# Patient Record
Sex: Male | Born: 1995 | Race: Black or African American | Hispanic: No | Marital: Single | Smoking: Current every day smoker
Health system: Southern US, Community
[De-identification: ages and names within clinical notes are randomized; demographics above are authoritative.]

## PROBLEM LIST (undated history)

## (undated) DIAGNOSIS — I319 Disease of pericardium, unspecified: Secondary | ICD-10-CM

## (undated) DIAGNOSIS — J45909 Unspecified asthma, uncomplicated: Secondary | ICD-10-CM

---

## 2016-11-10 ENCOUNTER — Emergency Department (HOSPITAL_BASED_OUTPATIENT_CLINIC_OR_DEPARTMENT_OTHER): Payer: BLUE CROSS/BLUE SHIELD

## 2016-11-10 ENCOUNTER — Emergency Department (HOSPITAL_BASED_OUTPATIENT_CLINIC_OR_DEPARTMENT_OTHER)
Admission: EM | Admit: 2016-11-10 | Discharge: 2016-11-10 | Disposition: A | Discharge status: Home / Self Care | Payer: BLUE CROSS/BLUE SHIELD | Attending: Emergency Medicine | Admitting: Emergency Medicine

## 2016-11-10 ENCOUNTER — Encounter (HOSPITAL_BASED_OUTPATIENT_CLINIC_OR_DEPARTMENT_OTHER): Payer: Self-pay | Admitting: Emergency Medicine

## 2016-11-10 DIAGNOSIS — R05 Cough: Secondary | ICD-10-CM | POA: Diagnosis present

## 2016-11-10 DIAGNOSIS — R0981 Nasal congestion: Secondary | ICD-10-CM

## 2016-11-10 DIAGNOSIS — J4521 Mild intermittent asthma with (acute) exacerbation: Secondary | ICD-10-CM | POA: Insufficient documentation

## 2016-11-10 DIAGNOSIS — R059 Cough, unspecified: Secondary | ICD-10-CM

## 2016-11-10 DIAGNOSIS — F17210 Nicotine dependence, cigarettes, uncomplicated: Secondary | ICD-10-CM | POA: Diagnosis not present

## 2016-11-10 DIAGNOSIS — J029 Acute pharyngitis, unspecified: Secondary | ICD-10-CM | POA: Insufficient documentation

## 2016-11-10 HISTORY — DX: Unspecified asthma, uncomplicated: J45.909

## 2016-11-10 MED ORDER — IPRATROPIUM BROMIDE 0.02 % IN SOLN
0.5000 mg | Freq: Once | RESPIRATORY_TRACT | Status: AC
  Administered 2016-11-10: 0.5 mg via RESPIRATORY_TRACT
  Filled 2016-11-10: qty 2.5

## 2016-11-10 MED ORDER — ALBUTEROL SULFATE HFA 108 (90 BASE) MCG/ACT IN AERS: 2.0000 | INHALATION_SPRAY | RESPIRATORY_TRACT | 3 refills | Status: AC | PRN

## 2016-11-10 MED ORDER — PREDNISONE 20 MG PO TABS: 60.0000 mg | ORAL_TABLET | Freq: Every day | ORAL | 0 refills | Status: AC

## 2016-11-10 MED ORDER — DOXYCYCLINE HYCLATE 100 MG PO CAPS: 100.0000 mg | ORAL_CAPSULE | Freq: Two times a day (BID) | ORAL | 0 refills | Status: AC

## 2016-11-10 MED ORDER — ALBUTEROL SULFATE (2.5 MG/3ML) 0.083% IN NEBU
5.0000 mg | INHALATION_SOLUTION | Freq: Once | RESPIRATORY_TRACT | Status: AC
  Administered 2016-11-10: 5 mg via RESPIRATORY_TRACT
  Filled 2016-11-10: qty 6

## 2016-11-10 NOTE — Discharge Instructions (Signed)
It was our pleasure to provide your ER care today - we hope that you feel better.  Use albuterol inhaler as need.  Take prednisone as prescribed.  Take doxycycline as prescribed.  Follow up with primary care doctor in 1 week if symptoms fail to improve/resolve.  Return to ER if worse, new symptoms, trouble breathing, other concern.

## 2016-11-10 NOTE — ED Triage Notes (Signed)
Patient states that for the last 4 days he has had chest congestion and a cough. He states that his chest rattle and then proceeds to show the nurse how he can make himself have audible rattles. PAtient states that he was with his father who have PNA recently so he is concerned

## 2016-11-10 NOTE — ED Provider Notes (Signed)
[facility] Provider [facility]   CSN: [id number] Arrival date & time: 11/10/16  1833  By signing my name below, I, Diona BrownerJennifer Gorman, attest that this documentation has been prepared under the direction and in the presence of Cathren LaineSteinl, Orlyn Odonoghue, MD. Electronically Signed: Diona BrownerJennifer Gorman, ED Scribe. 11/10/16. 8:03 PM.  History   Chief Complaint Chief Complaint  Patient presents with  . Cough    HPI Michael Pollard is a 21 y.o. male with a PMHx of asthma, who presents to the Emergency Department complaining of a productive cough for the last 4 days. Associated sx include sinus congestion, sore throat, sinus pressure, and wheezing. Pt has been around his father who was dx with PNA. He used his as needed inhaler today with mild relief. Pt denies fever, or any other complaints at this time.   The history is provided by the patient. No language interpreter was used.    Past Medical History:  Diagnosis Date  . Asthma     There are no active problems to display for this patient.   History reviewed. No pertinent surgical history.     Home Medications    Prior to Admission medications   Not on File    Family History History reviewed. No pertinent family history.  Social History Social History  Substance Use Topics  . Smoking status: Current Every Day Smoker  . Smokeless tobacco: Never Used  . Alcohol use Yes     Comment: rare     Allergies   Patient has no known allergies.   Review of Systems Review of Systems  Constitutional: Negative for fever.  HENT: Positive for congestion, sinus pressure and sore throat.   Eyes: Negative for redness.  Respiratory: Positive for cough. Negative for shortness of breath.   Cardiovascular: Negative for chest pain.  Gastrointestinal: Negative for abdominal pain.  Genitourinary: Negative for flank pain.  Musculoskeletal: Negative for back pain, neck pain and neck stiffness.  Skin: Negative for rash.  Neurological: Negative for  headaches.  Hematological: Does not bruise/bleed easily.  Psychiatric/Behavioral: Negative for confusion.     Physical Exam Updated Vital Signs BP 135/76 (BP Location: Left Arm)   Pulse 86   Temp 98.1 F (36.7 C) (Oral)   Resp 18   Ht 5\' 9"  (1.753 m)   Wt 208 lb (94.3 kg)   SpO2 100%   BMI 30.72 kg/m   Physical Exam  Constitutional: He appears well-developed and well-nourished.  HENT:  Head: Normocephalic.  Mouth/Throat: Oropharynx is clear and moist.  + Nasal congestion.   Eyes: EOM are normal.  Neck: Normal range of motion.  No stiffness or rigidity  Cardiovascular: Normal rate, regular rhythm, normal heart sounds and intact distal pulses.  Exam reveals no gallop and no friction rub.   No murmur heard. Pulmonary/Chest: Effort normal. He has wheezes.  + Bilateral wheezing.  Abdominal: Soft. He exhibits no distension. There is no tenderness.  Musculoskeletal: He exhibits no edema or tenderness.  Neurological: He is alert.  Skin: No rash noted.  Psychiatric: He has a normal mood and affect.  Nursing note and vitals reviewed.    ED Treatments / Results  DIAGNOSTIC STUDIES: Oxygen Saturation is 100% on RA, normal by my interpretation.   COORDINATION OF CARE: 8:03 PM-Discussed next steps with pt. Pt verbalized understanding and is agreeable with the plan.   Labs (all labs ordered are listed, but only abnormal results are displayed) Labs Reviewed - No data to display  EKG  EKG Interpretation  None       Radiology Dg Chest 2 View  Result Date: 11/10/2016 CLINICAL DATA:  Cough and congestion EXAM: CHEST  2 VIEW COMPARISON:  Report 12/27/2005 FINDINGS: The heart size and mediastinal contours are within normal limits. Both lungs are clear. The visualized skeletal structures are unremarkable. IMPRESSION: No active cardiopulmonary disease. Electronically Signed   By: Jasmine Pang M.D.   On: 11/10/2016 19:25    Procedures Procedures (including critical care  time)  Medications Ordered in ED Medications - No data to display   Initial Impression / Assessment and Plan / ED Course  I have reviewed the triage vital signs and the nursing notes.  Pertinent labs & imaging results that were available during my care of the patient were reviewed by me and considered in my medical decision making (see chart for details).  Albuterol and atrovent neb.  Recheck wheezing improved. Breathing comfortably.    MDM   Final Clinical Impressions(s) / ED Diagnoses   Final diagnoses:  None    New Prescriptions New Prescriptions   No medications on file   I personally performed the services described in this documentation, which was scribed in my presence. The recorded information has been reviewed and considered. Cathren Laine, MD     Cathren Laine, MD 11/10/16 [phone]

## 2019-01-17 IMAGING — CR DG CHEST 2V
2 series · 2 of 2 positions shown · non-contrast
Comparison: Report 12/27/2005

CLINICAL DATA: Cough and congestion

EXAM:
CHEST  2 VIEW

[w chest pa]
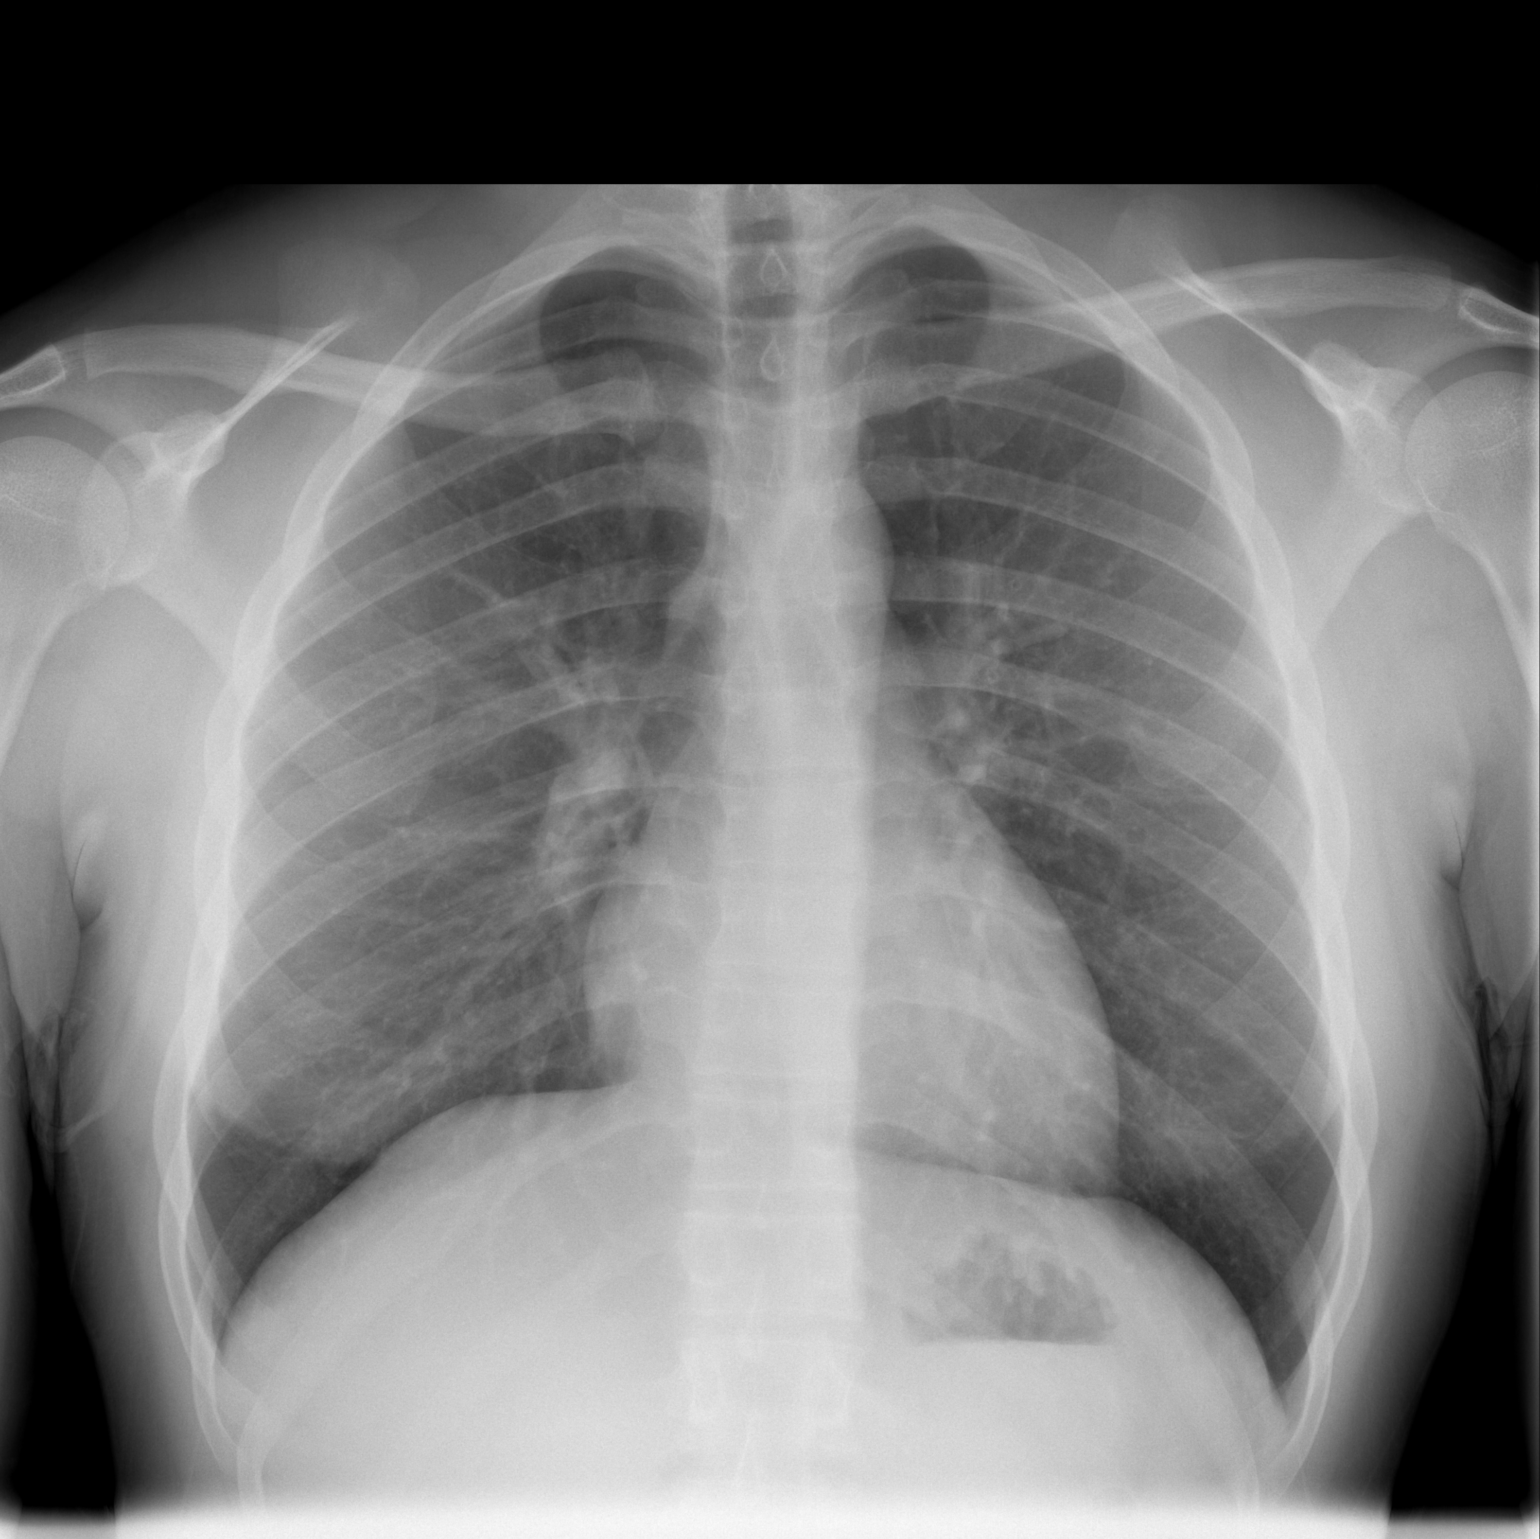

[w chest lat]
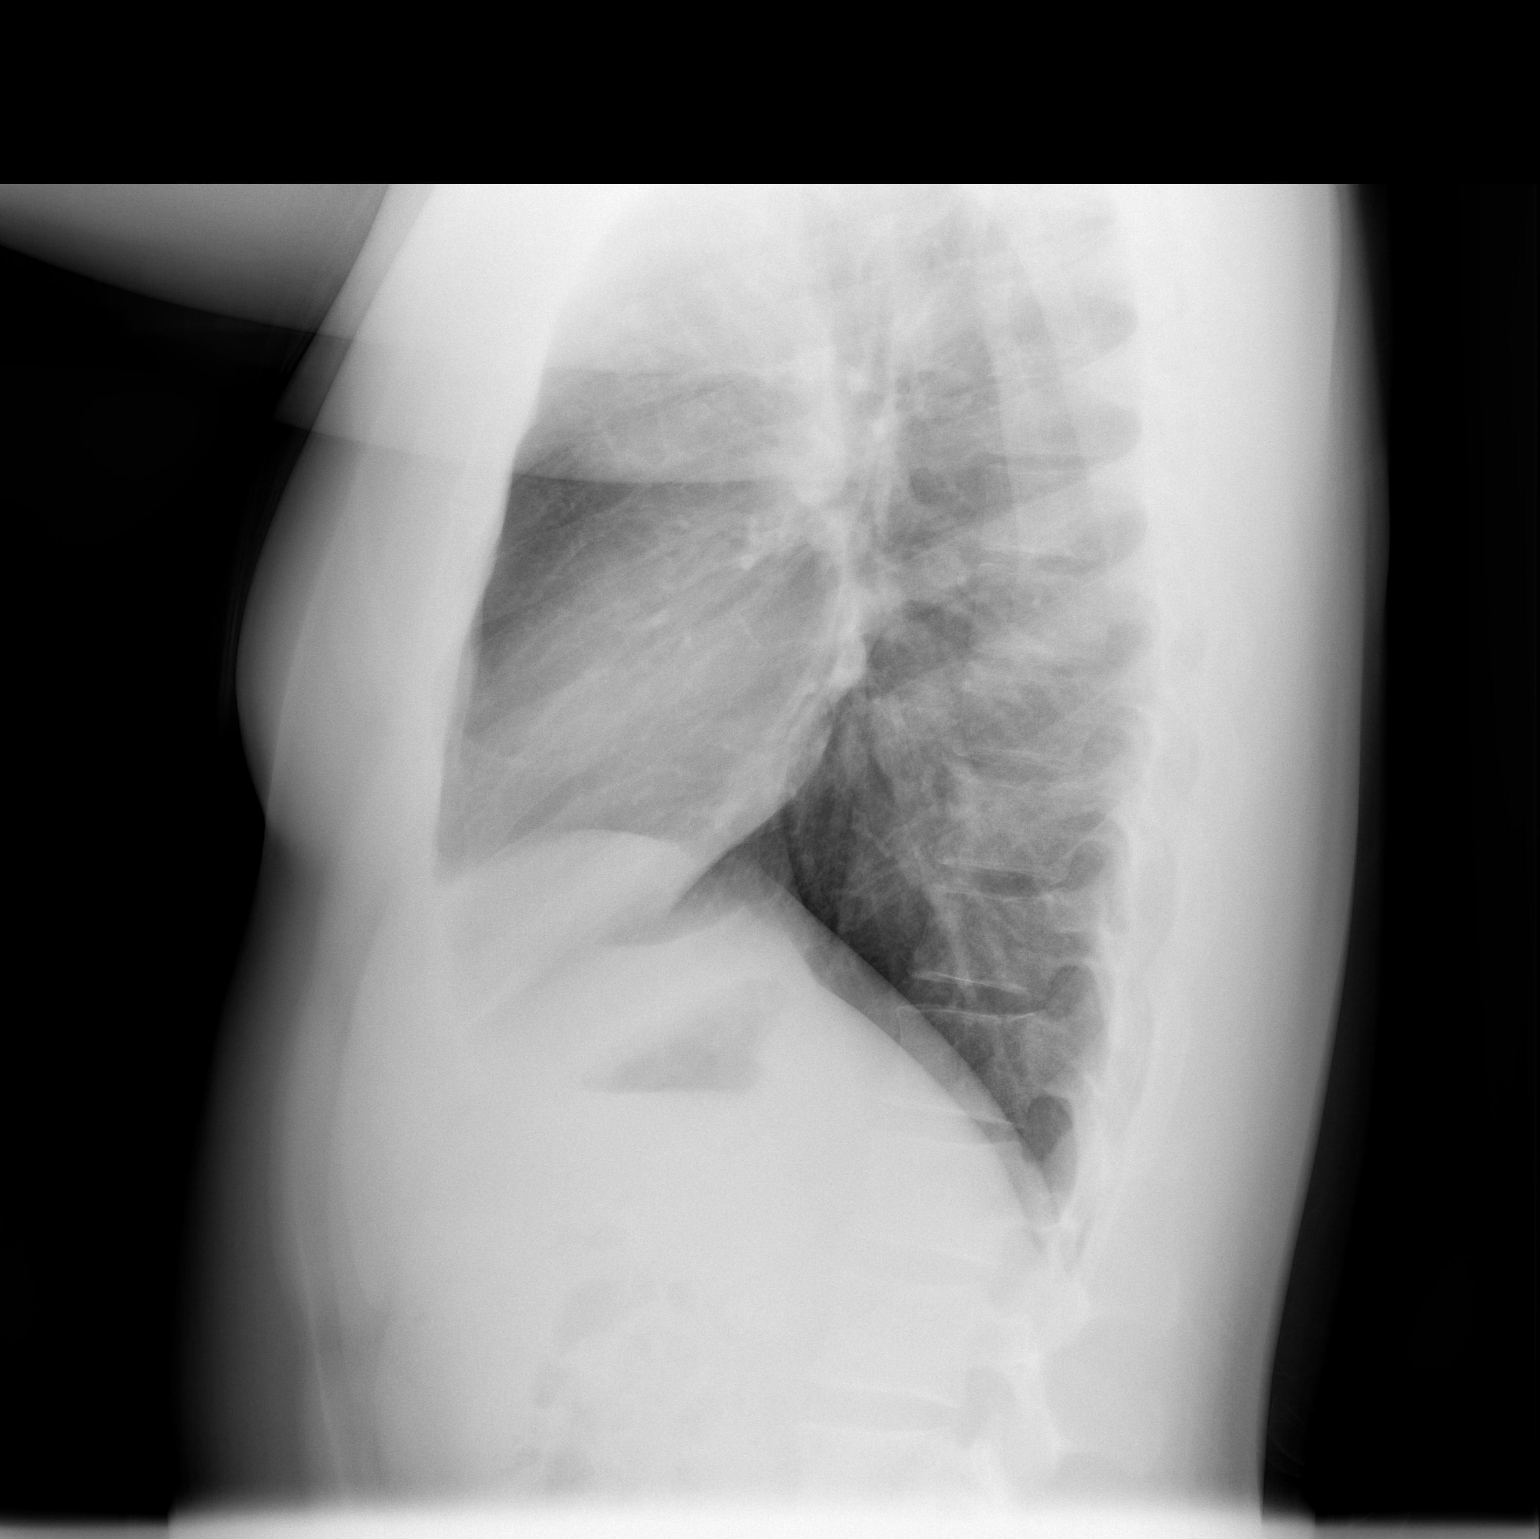

[2 of 2 positions shown; findings below may reference images not displayed]

FINDINGS: The heart size and mediastinal contours are within normal limits.
Both lungs are clear. The visualized skeletal structures are
unremarkable.
IMPRESSION: No active cardiopulmonary disease.

## 2019-03-25 ENCOUNTER — Other Ambulatory Visit: Payer: Self-pay

## 2019-03-25 ENCOUNTER — Emergency Department (HOSPITAL_BASED_OUTPATIENT_CLINIC_OR_DEPARTMENT_OTHER)
Admission: EM | Admit: 2019-03-25 | Discharge: 2019-03-25 | Disposition: A | Discharge status: Home / Self Care | Payer: 59 | Attending: Emergency Medicine | Admitting: Emergency Medicine

## 2019-03-25 ENCOUNTER — Encounter (HOSPITAL_BASED_OUTPATIENT_CLINIC_OR_DEPARTMENT_OTHER): Payer: Self-pay | Admitting: Emergency Medicine

## 2019-03-25 DIAGNOSIS — Z7901 Long term (current) use of anticoagulants: Secondary | ICD-10-CM | POA: Diagnosis not present

## 2019-03-25 DIAGNOSIS — F17200 Nicotine dependence, unspecified, uncomplicated: Secondary | ICD-10-CM | POA: Insufficient documentation

## 2019-03-25 DIAGNOSIS — Y999 Unspecified external cause status: Secondary | ICD-10-CM | POA: Diagnosis not present

## 2019-03-25 DIAGNOSIS — Y92410 Unspecified street and highway as the place of occurrence of the external cause: Secondary | ICD-10-CM | POA: Diagnosis not present

## 2019-03-25 DIAGNOSIS — S161XXA Strain of muscle, fascia and tendon at neck level, initial encounter: Secondary | ICD-10-CM | POA: Insufficient documentation

## 2019-03-25 DIAGNOSIS — Y929 Unspecified place or not applicable: Secondary | ICD-10-CM | POA: Diagnosis not present

## 2019-03-25 DIAGNOSIS — J45909 Unspecified asthma, uncomplicated: Secondary | ICD-10-CM | POA: Diagnosis not present

## 2019-03-25 DIAGNOSIS — Y939 Activity, unspecified: Secondary | ICD-10-CM | POA: Insufficient documentation

## 2019-03-25 DIAGNOSIS — S199XXA Unspecified injury of neck, initial encounter: Secondary | ICD-10-CM | POA: Diagnosis present

## 2019-03-25 HISTORY — DX: Disease of pericardium, unspecified: I31.9

## 2019-03-25 NOTE — ED Notes (Signed)
Patient verbalizes understanding of discharge instructions. Opportunity for questioning and answers were provided. Armband removed by staff, pt discharged from ED.  
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
## 2021-12-25 DIAGNOSIS — J302 Other seasonal allergic rhinitis: Secondary | ICD-10-CM | POA: Diagnosis not present

## 2021-12-25 DIAGNOSIS — J029 Acute pharyngitis, unspecified: Secondary | ICD-10-CM | POA: Diagnosis not present

## 2021-12-25 DIAGNOSIS — Z0001 Encounter for general adult medical examination with abnormal findings: Secondary | ICD-10-CM | POA: Diagnosis not present

## 2021-12-25 DIAGNOSIS — Z131 Encounter for screening for diabetes mellitus: Secondary | ICD-10-CM | POA: Diagnosis not present

## 2021-12-25 DIAGNOSIS — Z1329 Encounter for screening for other suspected endocrine disorder: Secondary | ICD-10-CM | POA: Diagnosis not present

## 2021-12-25 DIAGNOSIS — Z136 Encounter for screening for cardiovascular disorders: Secondary | ICD-10-CM | POA: Diagnosis not present

## 2021-12-25 DIAGNOSIS — J4520 Mild intermittent asthma, uncomplicated: Secondary | ICD-10-CM | POA: Diagnosis not present

## 2022-02-04 DIAGNOSIS — J039 Acute tonsillitis, unspecified: Secondary | ICD-10-CM | POA: Diagnosis not present

## 2023-01-31 DIAGNOSIS — R079 Chest pain, unspecified: Secondary | ICD-10-CM | POA: Diagnosis not present

## 2023-01-31 DIAGNOSIS — R0689 Other abnormalities of breathing: Secondary | ICD-10-CM | POA: Diagnosis not present

## 2023-01-31 DIAGNOSIS — Z8679 Personal history of other diseases of the circulatory system: Secondary | ICD-10-CM | POA: Diagnosis not present

## 2023-01-31 DIAGNOSIS — Z753 Unavailability and inaccessibility of health-care facilities: Secondary | ICD-10-CM | POA: Diagnosis not present

## 2023-01-31 DIAGNOSIS — R0789 Other chest pain: Secondary | ICD-10-CM | POA: Diagnosis not present

## 2023-07-25 DIAGNOSIS — R55 Syncope and collapse: Secondary | ICD-10-CM | POA: Diagnosis not present

## 2023-07-25 DIAGNOSIS — S0993XA Unspecified injury of face, initial encounter: Secondary | ICD-10-CM | POA: Diagnosis not present

## 2023-07-25 DIAGNOSIS — S032XXA Dislocation of tooth, initial encounter: Secondary | ICD-10-CM | POA: Diagnosis not present

## 2023-07-25 DIAGNOSIS — S0990XA Unspecified injury of head, initial encounter: Secondary | ICD-10-CM | POA: Diagnosis not present

## 2023-07-25 DIAGNOSIS — W19XXXA Unspecified fall, initial encounter: Secondary | ICD-10-CM | POA: Diagnosis not present

## 2023-07-25 DIAGNOSIS — S01511A Laceration without foreign body of lip, initial encounter: Secondary | ICD-10-CM | POA: Diagnosis not present

## 2023-07-26 DIAGNOSIS — S0993XA Unspecified injury of face, initial encounter: Secondary | ICD-10-CM | POA: Diagnosis not present

## 2023-07-26 DIAGNOSIS — R9431 Abnormal electrocardiogram [ECG] [EKG]: Secondary | ICD-10-CM | POA: Diagnosis not present

## 2023-08-17 DIAGNOSIS — R55 Syncope and collapse: Secondary | ICD-10-CM | POA: Diagnosis not present

## 2023-08-17 DIAGNOSIS — R197 Diarrhea, unspecified: Secondary | ICD-10-CM | POA: Diagnosis not present

## 2023-08-17 DIAGNOSIS — R110 Nausea: Secondary | ICD-10-CM | POA: Diagnosis not present

## 2023-08-17 DIAGNOSIS — U071 COVID-19: Secondary | ICD-10-CM | POA: Diagnosis not present

## 2023-08-17 DIAGNOSIS — R42 Dizziness and giddiness: Secondary | ICD-10-CM | POA: Diagnosis not present

## 2023-08-17 DIAGNOSIS — Z753 Unavailability and inaccessibility of health-care facilities: Secondary | ICD-10-CM | POA: Diagnosis not present

## 2023-08-18 DIAGNOSIS — R000 Tachycardia, unspecified: Secondary | ICD-10-CM | POA: Diagnosis not present

## 2023-10-22 DIAGNOSIS — F4321 Adjustment disorder with depressed mood: Secondary | ICD-10-CM | POA: Diagnosis not present

## 2024-02-20 DIAGNOSIS — Z79899 Other long term (current) drug therapy: Secondary | ICD-10-CM | POA: Diagnosis not present

## 2024-02-20 DIAGNOSIS — Z6827 Body mass index (BMI) 27.0-27.9, adult: Secondary | ICD-10-CM | POA: Diagnosis not present

## 2024-02-20 DIAGNOSIS — Z7251 High risk heterosexual behavior: Secondary | ICD-10-CM | POA: Diagnosis not present

## 2024-02-20 DIAGNOSIS — R300 Dysuria: Secondary | ICD-10-CM | POA: Diagnosis not present

## 2024-03-11 DIAGNOSIS — S39012A Strain of muscle, fascia and tendon of lower back, initial encounter: Secondary | ICD-10-CM | POA: Diagnosis not present

## 2024-03-14 DIAGNOSIS — K5900 Constipation, unspecified: Secondary | ICD-10-CM | POA: Diagnosis not present
